# Patient Record
Sex: Male | Born: 2005 | Race: Black or African American | Hispanic: No | Marital: Single | Smoking: Never smoker
Health system: Southern US, Community
[De-identification: ages and names within clinical notes are randomized; demographics above are authoritative.]

---
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
## 2023-12-23 ENCOUNTER — Ambulatory Visit

## 2023-12-27 ENCOUNTER — Ambulatory Visit
Admission: RE | Admit: 2023-12-27 | Discharge: 2023-12-27 | Disposition: A | Discharge status: Home / Self Care | Source: Ambulatory Visit | Attending: Emergency Medicine | Admitting: Emergency Medicine

## 2023-12-27 VITALS — BP 114/57 | HR 67 | Temp 99.5°F | Resp 16

## 2023-12-27 DIAGNOSIS — R3989 Other symptoms and signs involving the genitourinary system: Secondary | ICD-10-CM | POA: Diagnosis present

## 2023-12-27 LAB — POCT URINALYSIS DIP (MANUAL ENTRY)
Bilirubin, UA: NEGATIVE
Glucose, UA: NEGATIVE mg/dL
Ketones, POC UA: NEGATIVE mg/dL
Leukocytes, UA: NEGATIVE
Nitrite, UA: NEGATIVE
Protein Ur, POC: NEGATIVE mg/dL
Spec Grav, UA: 1.01
Urobilinogen, UA: 0.2 U/dL
pH, UA: 6.5

## 2023-12-27 NOTE — ED Provider Notes (Signed)
 Daniel Collins    CSN: [id number] Arrival date & time: 12/27/23  1419      History   Chief Complaint Chief Complaint  Patient presents with   Abdominal Pain    Urine has been foamy with Grae Leathers particulates, and slight pain has been in the lower abdomen, occasionally near kidney. - Entered by patient    HPI Daniel Collins is a 18 y.o. male.   Patient presents for evaluation of Hillary Schwegler particles in the urine with a slightly darkened urine present for 7 days.  Also experiencing intermittent bilateral lower abdominal pain described as a sharp aching pain when present, rating a 3 out of 10.  Occur sporadically without precipitating event.  Able to tolerate food and liquids.  Variance in urinary frequency but also has increased fluid intake so unsure if related.  Denies dysuria, hematuria, flank pain, fever, penile discharge, penile or testicle swelling.  Denies sexual activity and no known exposure.  History reviewed. No pertinent past medical history.  Patient Active Problem List   Diagnosis Date Noted   Chronic cough 12/15/2022   Encounter to establish care 12/15/2022    History reviewed. No pertinent surgical history.     Home Medications    Prior to Admission medications   Medication Sig Start Date End Date Taking? Authorizing Provider  fluticasone  (FLONASE ) 50 MCG/ACT nasal spray Place 2 sprays into both nostrils daily. 12/15/22   Dorothe Gaster, NP    Family History History reviewed. No pertinent family history.  Social History Social History   Tobacco Use   Smoking status: Never   Smokeless tobacco: Never  Vaping Use   Vaping status: Never Used  Substance Use Topics   Alcohol use: Yes    Comment: occ   Drug use: Never     Allergies   Patient has no known allergies.   Review of Systems Review of Systems   Physical Exam Triage Vital Signs ED Triage Vitals  Encounter Vitals Group     BP 12/27/23 1454 (!) 114/57     Systolic BP Percentile  --      Diastolic BP Percentile --      Pulse Rate 12/27/23 1454 67     Resp 12/27/23 1454 16     Temp 12/27/23 1454 99.5 F (37.5 C)     Temp Source 12/27/23 1454 Oral     SpO2 12/27/23 1454 97 %     Weight --      Height --      Head Circumference --      Peak Flow --      Pain Score 12/27/23 1455 2     Pain Loc --      Pain Education --      Exclude from Growth Chart --    No data found.  Updated Vital Signs BP (!) 114/57 (BP Location: Left Arm)   Pulse 67   Temp 99.5 F (37.5 C) (Oral)   Resp 16   SpO2 97%   Visual Acuity Right Eye Distance:   Left Eye Distance:   Bilateral Distance:    Right Eye Near:   Left Eye Near:    Bilateral Near:     Physical Exam Constitutional:      Appearance: Normal appearance.  Eyes:     Extraocular Movements: Extraocular movements intact.  Pulmonary:     Effort: Pulmonary effort is normal.  Abdominal:     General: There is no distension.     Tenderness:  There is no abdominal tenderness. There is no right CVA tenderness, left CVA tenderness or guarding.  Neurological:     Mental Status: He is alert and oriented to person, place, and time. Mental status is at baseline.      UC Treatments / Results  Labs (all labs ordered are listed, but only abnormal results are displayed) Labs Reviewed  POCT URINALYSIS DIP (MANUAL ENTRY) - Abnormal; Notable for the following components:      Result Value   Blood, UA trace-intact (*)    All other components within normal limits  URINE CULTURE    EKG   Radiology No results found.  Procedures Procedures (including critical care time)  Medications Ordered in UC Medications - No data to display  Initial Impression / Assessment and Plan / UC Course  I have reviewed the triage vital signs and the nursing notes.  Pertinent labs & imaging results that were available during my care of the patient were reviewed by me and considered in my medical decision making (see chart for  details).  Abnormal urine color  Urinalysis negative, sent for culture, would like to defer to use of antibiotics at this time, no abdominal tenderness noted on exam, low suspicion for an acutely inflamed organ, presentation of symptoms is not consistent with kidney stone, low suspicion of cause, advised to monitor and given urology referral if symptoms persist Final Clinical Impressions(s) / UC Diagnoses   Final diagnoses:  Abnormal urine color   Discharge Instructions      Today you were evaluated for your urine and abdominal pain  On exam I am not able to cause pain by touching which is reassurance that most likely organ is not seriously involved  Urinalysis today is negative for bladder infection, urine has been sent to the lab for 3 days to see if bacteria will grow, you will be notified if this occurs and placed on antibiotics  Continue to keep fluid intake increase to help flush out the kidneys and bladder  If you continue to have symptoms you may follow-up with the urologist which is the bladder specialist, information is listed on front page  ED Prescriptions   None    PDMP not reviewed this encounter.   Reena Canning, [place] 12/27/23 [phone]

## 2023-12-27 NOTE — ED Triage Notes (Signed)
 Pt states he is having white particles in his urine x 1 week with lower abdominal pain that comes and goes.

## 2023-12-27 NOTE — Discharge Instructions (Signed)
 Today you were evaluated for your urine and abdominal pain  On exam I am not able to cause pain by touching which is reassurance that most likely organ is not seriously involved  Urinalysis today is negative for bladder infection, urine has been sent to the lab for 3 days to see if bacteria will grow, you will be notified if this occurs and placed on antibiotics  Continue to keep fluid intake increase to help flush out the kidneys and bladder  If you continue to have symptoms you may follow-up with the urologist which is the bladder specialist, information is listed on front page

## 2023-12-28 LAB — URINE CULTURE: Culture: NO GROWTH

## 2023-12-29 ENCOUNTER — Ambulatory Visit: Payer: Self-pay

## 2024-03-15 ENCOUNTER — Encounter: Payer: Self-pay | Admitting: Nurse Practitioner

## 2024-03-15 ENCOUNTER — Ambulatory Visit (INDEPENDENT_AMBULATORY_CARE_PROVIDER_SITE_OTHER): Admitting: Nurse Practitioner

## 2024-03-15 VITALS — BP 110/60 | HR 81 | Temp 98.0°F | Ht 75.0 in | Wt 166.6 lb

## 2024-03-15 DIAGNOSIS — Z0000 Encounter for general adult medical examination without abnormal findings: Secondary | ICD-10-CM | POA: Diagnosis not present

## 2024-03-15 DIAGNOSIS — Z131 Encounter for screening for diabetes mellitus: Secondary | ICD-10-CM

## 2024-03-15 DIAGNOSIS — Z23 Encounter for immunization: Secondary | ICD-10-CM

## 2024-03-15 DIAGNOSIS — R053 Chronic cough: Secondary | ICD-10-CM

## 2024-03-15 DIAGNOSIS — Z1329 Encounter for screening for other suspected endocrine disorder: Secondary | ICD-10-CM

## 2024-03-15 DIAGNOSIS — Z13220 Encounter for screening for lipoid disorders: Secondary | ICD-10-CM

## 2024-03-15 NOTE — Patient Instructions (Signed)
 Nice to see you today  We did the 2 of 3 HPV vaccines. Get the final me in 5 months.  Follow up with me in 1 year, sooner if you need me

## 2024-03-15 NOTE — Assessment & Plan Note (Signed)
 Resolved with Flonase use.  Patient only having a cough or having used the fluticasone

## 2024-03-15 NOTE — Assessment & Plan Note (Signed)
 Discussed age-appropriate immunizations and screening exams.  Did review patient's personal, surgical, social, family histories.  Patient is up-to-date on all age-appropriate vaccinations he would like.  Administer second HPV vaccine today.  Patient is too young for CRC screening or prostate cancer screening.  Patient was given information at discharge about preventative healthcare maintenance with anticipatory guidance.  Patient was offered to be screened for STIs but politely declined.

## 2024-03-15 NOTE — Progress Notes (Signed)
 Established Patient Office Visit  Subjective   Patient ID: Daniel Collins, male    DOB: Jan 07, 2006  Age: 18 y.o. MRN: [id number]  Chief Complaint  Patient presents with   Annual Exam    HPI  for complete physical and follow up of chronic conditions.  Immunizations: -Tetanus: Completed in 2017 -Influenza: out of season  -Shingles: too young -Pneumonia: too young -covid: -HPV: 1st vaccine, administer second vaccine today  Diet: Fair diet. He is eating 3 meals a day. Does not snakc. Water and ginger ale. Sometimes sweet tea  Exercise: does the gym 60 mins every other day . He will run 2-3 days a week and then row   Eye exam: Completes annually. contacts Dental exam: Completes semi-annually    Colonoscopy: too young, currently average risk  Lung Cancer Screening: N/A  PSA: too young, currently average risk   Sleep: going to bed around 1030-11 and will get up aroun 7-8. Feels rested. States he does not snore   STI: Screened over the summer declines currently     03/15/2024    2:04 PM 12/15/2022    1:38 PM  PHQ9 SCORE ONLY  PHQ-9 Total Score 10 5       03/15/2024    2:05 PM 12/15/2022    1:38 PM  GAD 7 : Generalized Anxiety Score  Nervous, Anxious, on Edge 2 1  Control/stop worrying 1 0  Worry too much - different things 2 0  Trouble relaxing 2 1  Restless 0 0  Easily annoyed or irritable 1 0  Afraid - awful might happen 0 0  Total GAD 7 Score 8 2  Anxiety Difficulty Not difficult at all Not difficult at all          Review of Systems  Constitutional:  Negative for chills and fever.  Respiratory:  Negative for shortness of breath.   Cardiovascular:  Negative for chest pain and leg swelling.  Gastrointestinal:  Negative for abdominal pain, blood in stool, constipation, diarrhea, nausea and vomiting.       BMdaily to every other day   Genitourinary:  Negative for dysuria and hematuria.  Neurological:  Negative for dizziness, tingling and headaches.   Psychiatric/Behavioral:  Negative for hallucinations and suicidal ideas.       Objective:     BP 110/60   Pulse 81   Temp 98 F (36.7 C) (Oral)   Ht 6' 3 (1.905 m)   Wt 166 lb 9.6 oz (75.6 kg)   SpO2 94%   BMI 20.82 kg/m  BP Readings from Last 3 Encounters:  03/15/24 110/60  12/27/23 (!) 114/57  12/15/22 112/62   Wt Readings from Last 3 Encounters:  03/15/24 166 lb 9.6 oz (75.6 kg) (67%, Z= 0.43)*  12/15/22 153 lb 6 oz (69.6 kg) (55%, Z= 0.11)*  11/11/15 85 lb 8.6 oz (38.8 kg) (55%, Z= 0.12)*   * Growth percentiles are based on CDC (Boys, 2-20 Years) data.   SpO2 Readings from Last 3 Encounters:  03/15/24 94%  12/27/23 97%  12/15/22 99%      Physical Exam Vitals and nursing note reviewed.  Constitutional:      Appearance: Normal appearance.  HENT:     Right Ear: Tympanic membrane, ear canal and external ear normal.     Left Ear: Tympanic membrane, ear canal and external ear normal.     Mouth/Throat:     Mouth: Mucous membranes are moist.     Pharynx: Oropharynx is clear.  Eyes:  Extraocular Movements: Extraocular movements intact.     Pupils: Pupils are equal, round, and reactive to light.  Cardiovascular:     Rate and Rhythm: Normal rate and regular rhythm.     Pulses: Normal pulses.     Heart sounds: Normal heart sounds.  Pulmonary:     Effort: Pulmonary effort is normal.     Breath sounds: Normal breath sounds.  Abdominal:     General: Bowel sounds are normal. There is no distension.     Palpations: There is no mass.     Tenderness: There is no abdominal tenderness.     Hernia: No hernia is present.  Genitourinary:    Comments: Deferred Musculoskeletal:     Right lower leg: No edema.     Left lower leg: No edema.  Lymphadenopathy:     Cervical: No cervical adenopathy.  Skin:    General: Skin is warm.  Neurological:     General: No focal deficit present.     Mental Status: He is alert.     Deep Tendon Reflexes:     Reflex Scores:       Bicep reflexes are 2+ on the right side and 2+ on the left side.      Patellar reflexes are 2+ on the right side and 2+ on the left side.    Comments: Bilateral upper and lower extremity strength 5/5  Psychiatric:        Mood and Affect: Mood normal.        Behavior: Behavior normal.        Thought Content: Thought content normal.        Judgment: Judgment normal.      No results found for any visits on 03/15/24.    The ASCVD Risk score (Arnett DK, et al., 2019) failed to calculate for the following reasons:   The 2019 ASCVD risk score is only valid for ages 78 to 3    Assessment & Plan:   Problem List Items Addressed This Visit       Other   Chronic cough   Resolved with Flonase use.  Patient only having a cough or having used the fluticasone      Preventative health care - Primary   Discussed age-appropriate immunizations and screening exams.  Did review patient's personal, surgical, social, family histories.  Patient is up-to-date on all age-appropriate vaccinations he would like.  Administer second HPV vaccine today.  Patient is too young for CRC screening or prostate cancer screening.  Patient was given information at discharge about preventative healthcare maintenance with anticipatory guidance.  Patient was offered to be screened for STIs but politely declined.      Relevant Orders   CBC with Differential/Platelet   Comprehensive metabolic panel with GFR   Other Visit Diagnoses       Need for HPV vaccination       Relevant Orders   HPV 9-valent vaccine,Recombinat (Completed)     Screening for diabetes mellitus       Relevant Orders   Hemoglobin A1c     Screening for lipid disorders       Relevant Orders   Lipid panel     Screening for endocrine disorder       Relevant Orders   TSH       Return in about 1 year (around 03/15/2025) for CPE and Labs.    Adina Crandall, NP

## 2024-03-16 ENCOUNTER — Ambulatory Visit: Payer: Self-pay | Admitting: Nurse Practitioner

## 2024-03-16 LAB — COMPREHENSIVE METABOLIC PANEL WITH GFR
AG Ratio: 2.3 (calc) (ref 1.0–2.5)
ALT: 9 U/L (ref 8–46)
AST: 15 U/L (ref 12–32)
Albumin: 4.5 g/dL (ref 3.6–5.1)
Alkaline phosphatase (APISO): 57 U/L (ref 46–169)
BUN: 14 mg/dL (ref 7–20)
CO2: 26 mmol/L (ref 20–32)
Calcium: 9.5 mg/dL (ref 8.9–10.4)
Chloride: 103 mmol/L (ref 98–110)
Creat: 1.06 mg/dL (ref 0.60–1.24)
Globulin: 2 g/dL — ABNORMAL LOW (ref 2.1–3.5)
Glucose, Bld: 64 mg/dL — ABNORMAL LOW (ref 65–99)
Potassium: 4.8 mmol/L (ref 3.8–5.1)
Sodium: 139 mmol/L (ref 135–146)
Total Bilirubin: 1.2 mg/dL — ABNORMAL HIGH (ref 0.2–1.1)
Total Protein: 6.5 g/dL (ref 6.3–8.2)
eGFR: 104 mL/min/1.73m2 (ref >=60)

## 2024-03-16 LAB — CBC WITH DIFFERENTIAL/PLATELET
Absolute Lymphocytes: 1595 {cells}/uL (ref 850–3900)
Absolute Monocytes: 424 {cells}/uL (ref 200–950)
Basophils Absolute: 39 {cells}/uL (ref 0–200)
Basophils Relative: 0.7 %
Eosinophils Absolute: 11 {cells}/uL — ABNORMAL LOW (ref 15–500)
Eosinophils Relative: 0.2 %
HCT: 44.7 % (ref 38.5–50.0)
Hemoglobin: 14.9 g/dL (ref 13.2–17.1)
MCH: 31.3 pg (ref 27.0–33.0)
MCHC: 33.3 g/dL (ref 32.0–36.0)
MCV: 93.9 fL (ref 80.0–100.0)
MPV: 10.2 fL (ref 7.5–12.5)
Monocytes Relative: 7.7 %
Neutro Abs: 3432 {cells}/uL (ref 1500–7800)
Neutrophils Relative %: 62.4 %
Platelets: 223 Thousand/uL (ref 140–400)
RBC: 4.76 Million/uL (ref 4.20–5.80)
RDW: 13.2 % (ref 11.0–15.0)
Total Lymphocyte: 29 %
WBC: 5.5 Thousand/uL (ref 3.8–10.8)

## 2024-03-16 LAB — LIPID PANEL
Cholesterol: 157 mg/dL (ref <170)
HDL: 48 mg/dL (ref >45)
LDL Cholesterol (Calc): 86 mg/dL (ref <110)
Non-HDL Cholesterol (Calc): 109 mg/dL (ref <120)
Total CHOL/HDL Ratio: 3.3 (calc) (ref <5.0)
Triglycerides: 134 mg/dL — ABNORMAL HIGH (ref <90)

## 2024-03-16 LAB — TSH: TSH: 1.44 m[IU]/L (ref 0.50–4.30)

## 2024-03-16 LAB — HEMOGLOBIN A1C
Hgb A1c MFr Bld: 5.3 % (ref <5.7)
Mean Plasma Glucose: 105 mg/dL
eAG (mmol/L): 5.8 mmol/L
